# Patient Record
Sex: Female | Born: 1935 | Race: White | Hispanic: No | Smoking: Never smoker
Health system: Southern US, Community
[De-identification: ages and names within clinical notes are randomized; demographics above are authoritative.]

## PROBLEM LIST (undated history)

## (undated) DIAGNOSIS — E079 Disorder of thyroid, unspecified: Secondary | ICD-10-CM

## (undated) DIAGNOSIS — K219 Gastro-esophageal reflux disease without esophagitis: Secondary | ICD-10-CM

## (undated) DIAGNOSIS — E119 Type 2 diabetes mellitus without complications: Secondary | ICD-10-CM

## (undated) DIAGNOSIS — I10 Essential (primary) hypertension: Secondary | ICD-10-CM

## (undated) DIAGNOSIS — E876 Hypokalemia: Secondary | ICD-10-CM

---

## 2008-12-12 ENCOUNTER — Emergency Department (HOSPITAL_BASED_OUTPATIENT_CLINIC_OR_DEPARTMENT_OTHER): Admission: EM | Admit: 2008-12-12 | Discharge: 2008-12-12 | Payer: Self-pay | Admitting: Emergency Medicine

## 2008-12-12 ENCOUNTER — Ambulatory Visit: Payer: Self-pay | Admitting: Diagnostic Radiology

## 2011-02-02 ENCOUNTER — Emergency Department (HOSPITAL_BASED_OUTPATIENT_CLINIC_OR_DEPARTMENT_OTHER)
Admission: EM | Admit: 2011-02-02 | Discharge: 2011-02-02 | Disposition: A | Discharge status: Home / Self Care | Payer: No Typology Code available for payment source | Attending: Emergency Medicine | Admitting: Emergency Medicine

## 2011-02-02 ENCOUNTER — Emergency Department (INDEPENDENT_AMBULATORY_CARE_PROVIDER_SITE_OTHER): Payer: No Typology Code available for payment source

## 2011-02-02 DIAGNOSIS — E039 Hypothyroidism, unspecified: Secondary | ICD-10-CM | POA: Insufficient documentation

## 2011-02-02 DIAGNOSIS — E119 Type 2 diabetes mellitus without complications: Secondary | ICD-10-CM | POA: Insufficient documentation

## 2011-02-02 DIAGNOSIS — R4182 Altered mental status, unspecified: Secondary | ICD-10-CM

## 2011-02-02 DIAGNOSIS — Z79899 Other long term (current) drug therapy: Secondary | ICD-10-CM | POA: Insufficient documentation

## 2011-02-02 DIAGNOSIS — I10 Essential (primary) hypertension: Secondary | ICD-10-CM | POA: Insufficient documentation

## 2011-02-02 DIAGNOSIS — K219 Gastro-esophageal reflux disease without esophagitis: Secondary | ICD-10-CM | POA: Insufficient documentation

## 2011-02-02 DIAGNOSIS — R55 Syncope and collapse: Secondary | ICD-10-CM | POA: Insufficient documentation

## 2011-02-02 DIAGNOSIS — E7800 Pure hypercholesterolemia, unspecified: Secondary | ICD-10-CM | POA: Insufficient documentation

## 2011-02-02 LAB — DIFFERENTIAL
Basophils Absolute: 0 10*3/uL (ref 0.0–0.1)
Eosinophils Absolute: 0.1 10*3/uL (ref 0.0–0.7)
Eosinophils Relative: 2 % (ref 0–5)
Lymphocytes Relative: 32 % (ref 12–46)
Lymphs Abs: 2.7 10*3/uL (ref 0.7–4.0)
Neutrophils Relative %: 57 % (ref 43–77)

## 2011-02-02 LAB — COMPREHENSIVE METABOLIC PANEL
ALT: 14 U/L (ref 0–35)
AST: 23 U/L (ref 0–37)
Albumin: 4.3 g/dL (ref 3.5–5.2)
Alkaline Phosphatase: 74 U/L (ref 39–117)
BUN: 12 mg/dL (ref 6–23)
Chloride: 105 mEq/L (ref 96–112)
Potassium: 3.5 mEq/L (ref 3.5–5.1)
Sodium: 143 mEq/L (ref 135–145)
Total Bilirubin: 0.7 mg/dL (ref 0.3–1.2)
Total Protein: 7.7 g/dL (ref 6.0–8.3)

## 2011-02-02 LAB — URINALYSIS, ROUTINE W REFLEX MICROSCOPIC
Glucose, UA: NEGATIVE mg/dL
Hgb urine dipstick: NEGATIVE
Protein, ur: NEGATIVE mg/dL
Specific Gravity, Urine: 1.019 (ref 1.005–1.030)
pH: 6.5 (ref 5.0–8.0)

## 2011-02-02 LAB — URINE MICROSCOPIC-ADD ON

## 2011-02-02 LAB — CBC
MCV: 87.1 fL (ref 78.0–100.0)
Platelets: 230 10*3/uL (ref 150–400)
RBC: 4.64 MIL/uL (ref 3.87–5.11)
RDW: 13 % (ref 11.5–15.5)
WBC: 8.6 10*3/uL (ref 4.0–10.5)

## 2011-02-03 LAB — URINE CULTURE: Colony Count: NO GROWTH

## 2011-02-28 LAB — BASIC METABOLIC PANEL
CO2: 24 mEq/L (ref 19–32)
Calcium: 10.3 mg/dL (ref 8.4–10.5)
GFR calc Af Amer: >60 mL/min (ref >60)
Glucose, Bld: 144 mg/dL — ABNORMAL HIGH (ref 70–99)
Potassium: 3.8 mEq/L (ref 3.5–5.1)
Sodium: 140 mEq/L (ref 135–145)

## 2011-02-28 LAB — DIFFERENTIAL
Eosinophils Relative: 2 % (ref 0–5)
Lymphocytes Relative: 34 % (ref 12–46)
Monocytes Absolute: 0.5 10*3/uL (ref 0.1–1.0)
Monocytes Relative: 7 % (ref 3–12)
Neutro Abs: 4.5 10*3/uL (ref 1.7–7.7)

## 2011-02-28 LAB — URINALYSIS, ROUTINE W REFLEX MICROSCOPIC
Bilirubin Urine: NEGATIVE
Hgb urine dipstick: NEGATIVE
Ketones, ur: NEGATIVE mg/dL
Specific Gravity, Urine: 1.009 (ref 1.005–1.030)
pH: 8 (ref 5.0–8.0)

## 2011-02-28 LAB — CBC
HCT: 42.1 % (ref 36.0–46.0)
Hemoglobin: 14.2 g/dL (ref 12.0–15.0)
MCHC: 33.7 g/dL (ref 30.0–36.0)
RBC: 4.65 MIL/uL (ref 3.87–5.11)

## 2011-02-28 LAB — POCT CARDIAC MARKERS
CKMB, poc: 1.4 ng/mL (ref 1.0–8.0)
Myoglobin, poc: 40.8 ng/mL (ref 12–200)
Troponin i, poc: <0.05 ng/mL (ref 0.00–0.09)

## 2013-08-03 ENCOUNTER — Emergency Department (HOSPITAL_BASED_OUTPATIENT_CLINIC_OR_DEPARTMENT_OTHER)
Admission: EM | Admit: 2013-08-03 | Discharge: 2013-08-03 | Disposition: A | Discharge status: Skilled Nursing Facility | Payer: Medicare HMO | Attending: Emergency Medicine | Admitting: Emergency Medicine

## 2013-08-03 ENCOUNTER — Encounter (HOSPITAL_BASED_OUTPATIENT_CLINIC_OR_DEPARTMENT_OTHER): Payer: Self-pay

## 2013-08-03 DIAGNOSIS — Z79899 Other long term (current) drug therapy: Secondary | ICD-10-CM | POA: Insufficient documentation

## 2013-08-03 DIAGNOSIS — Z880 Allergy status to penicillin: Secondary | ICD-10-CM | POA: Insufficient documentation

## 2013-08-03 DIAGNOSIS — M545 Low back pain, unspecified: Secondary | ICD-10-CM | POA: Insufficient documentation

## 2013-08-03 DIAGNOSIS — M549 Dorsalgia, unspecified: Secondary | ICD-10-CM

## 2013-08-03 DIAGNOSIS — E119 Type 2 diabetes mellitus without complications: Secondary | ICD-10-CM | POA: Insufficient documentation

## 2013-08-03 DIAGNOSIS — I10 Essential (primary) hypertension: Secondary | ICD-10-CM | POA: Insufficient documentation

## 2013-08-03 DIAGNOSIS — K219 Gastro-esophageal reflux disease without esophagitis: Secondary | ICD-10-CM | POA: Insufficient documentation

## 2013-08-03 DIAGNOSIS — E079 Disorder of thyroid, unspecified: Secondary | ICD-10-CM | POA: Insufficient documentation

## 2013-08-03 HISTORY — DX: Gastro-esophageal reflux disease without esophagitis: K21.9

## 2013-08-03 HISTORY — DX: Hypokalemia: E87.6

## 2013-08-03 HISTORY — DX: Type 2 diabetes mellitus without complications: E11.9

## 2013-08-03 HISTORY — DX: Disorder of thyroid, unspecified: E07.9

## 2013-08-03 HISTORY — DX: Essential (primary) hypertension: I10

## 2013-08-03 LAB — URINALYSIS, ROUTINE W REFLEX MICROSCOPIC
Leukocytes, UA: NEGATIVE
Nitrite: NEGATIVE
Protein, ur: NEGATIVE mg/dL
Urobilinogen, UA: 0.2 mg/dL (ref 0.0–1.0)

## 2013-08-03 NOTE — ED Notes (Signed)
Patient arrived by EMS from [facility] with lower backpain. Patient denies fall but staff unable to verify that. On arrival patient has sling to right arm from fall and injury earlier in month. Alert and oriented and per EMS uses no assistive devices at nursing facility for ambulation

## 2013-08-03 NOTE — ED Provider Notes (Signed)
CSN: [id number]     Arrival date & time 08/03/13  1421 History   First MD Initiated Contact with Patient 08/03/13 1554     Chief Complaint  Patient presents with  . Back Pain   (Consider location/radiation/quality/duration/timing/severity/associated sxs/prior Treatment) HPI Comments: Pt denies any complaints.   Pt reports she thinks that the people at [facility] are pulling her leg.   Pt denies any complaints.   Nursing home staff reports pt complained of pain when walking.  Pt denies any current pain    Patient is a 79 y.o. female presenting with back pain. The history is provided by the patient and the nursing home. No language interpreter was used.  Back Pain Associated symptoms: no dysuria     Past Medical History  Diagnosis Date  . Hypokalemia   . Hypertension   . Diabetes mellitus without complication   . GERD (gastroesophageal reflux disease)   . Thyroid disease    History reviewed. No pertinent past surgical history. No family history on file. History  Substance Use Topics  . Smoking status: Never Smoker   . Smokeless tobacco: Not on file  . Alcohol Use: No   OB History   Grav Para Term Preterm Abortions TAB SAB Ect Mult Living                 Review of Systems  Genitourinary: Negative for dysuria, frequency and hematuria.  Musculoskeletal: Negative for back pain and gait problem.  All other systems reviewed and are negative.    Allergies  Penicillins  Home Medications   Current Outpatient Rx  Name  Route  Sig  Dispense  Refill  . alendronate (FOSAMAX) 70 MG tablet   Oral   Take 70 mg by mouth every 7 (seven) days. Take with a full glass of water on an empty stomach.         Marland Kitchen amLODipine (NORVASC) 10 MG tablet   Oral   Take 10 mg by mouth daily.         Marland Kitchen atorvastatin (LIPITOR) 10 MG tablet   Oral   Take 10 mg by mouth daily.         Marland Kitchen docusate sodium (COLACE) 100 MG capsule   Oral   Take 100 mg by mouth 2 (two) times daily.          Marland Kitchen docusate sodium (COLACE) 100 MG capsule   Oral   Take 100 mg by mouth 2 (two) times daily.         Marland Kitchen donepezil (ARICEPT) 10 MG tablet   Oral   Take 10 mg by mouth at bedtime as needed.         . fluticasone (FLONASE) 50 MCG/ACT nasal spray   Nasal   Place 2 sprays into the nose daily.         Marland Kitchen HYDROcodone-acetaminophen (NORCO/VICODIN) 5-325 MG per tablet   Oral   Take 1 tablet by mouth every 6 (six) hours as needed for pain.         Marland Kitchen levothyroxine (SYNTHROID, LEVOTHROID) 50 MCG tablet   Oral   Take 50 mcg by mouth daily before breakfast.         . memantine (NAMENDA) 10 MG tablet   Oral   Take 10 mg by mouth 2 (two) times daily.         Marland Kitchen omeprazole (PRILOSEC) 20 MG capsule   Oral   Take 20 mg by mouth daily.         Marland Kitchen  polyethylene glycol (MIRALAX / GLYCOLAX) packet   Oral   Take 17 g by mouth daily.          BP 113/59  Pulse 56  Temp(Src) 97.7 F (36.5 C) (Oral)  Resp 16  Wt 130 lb (58.968 kg)  SpO2 99% Physical Exam  Nursing note and vitals reviewed. Constitutional: She appears well-developed and well-nourished.  HENT:  Head: Normocephalic.  Eyes: EOM are normal. Pupils are equal, round, and reactive to light.  Neck: Normal range of motion.  Cardiovascular: Normal rate and regular rhythm.   Pulmonary/Chest: Effort normal and breath sounds normal.  Abdominal: Soft. She exhibits no distension.  Musculoskeletal: Normal range of motion.  Neurological: She is alert.  Skin: Skin is warm.  Psychiatric: She has a normal mood and affect.    ED Course  Procedures (including critical care time) Labs Review Labs Reviewed  URINALYSIS, ROUTINE W REFLEX MICROSCOPIC   Imaging Review No results found.  MDM   1. Back pain    Urine is negative   Pt continues to deny complaints.   Pt ambulates without difficulty    Elson Areas, [place] 08/03/13 [id number]

## 2013-08-03 NOTE — ED Provider Notes (Signed)
Medical screening examination/treatment/procedure(s) were conducted as a shared visit with non-physician practitioner(s) and myself.  I personally evaluated the patient during the encounter.  Pt denies back pain and back NT.  Hurman Horn, MD 08/04/13 [phone]

## 2013-08-03 NOTE — ED Notes (Signed)
[facility] called to transport back to [facility]

## 2014-11-05 ENCOUNTER — Encounter (HOSPITAL_COMMUNITY): Payer: Self-pay | Admitting: Emergency Medicine

## 2014-11-05 ENCOUNTER — Emergency Department (HOSPITAL_COMMUNITY)
Admission: EM | Admit: 2014-11-05 | Discharge: 2014-11-05 | Disposition: A | Discharge status: Home / Self Care | Payer: Medicare HMO | Attending: Emergency Medicine | Admitting: Emergency Medicine

## 2014-11-05 ENCOUNTER — Emergency Department (HOSPITAL_COMMUNITY): Payer: Medicare HMO

## 2014-11-05 DIAGNOSIS — Y998 Other external cause status: Secondary | ICD-10-CM | POA: Diagnosis not present

## 2014-11-05 DIAGNOSIS — Y9289 Other specified places as the place of occurrence of the external cause: Secondary | ICD-10-CM | POA: Diagnosis not present

## 2014-11-05 DIAGNOSIS — Y9301 Activity, walking, marching and hiking: Secondary | ICD-10-CM | POA: Diagnosis not present

## 2014-11-05 DIAGNOSIS — Z880 Allergy status to penicillin: Secondary | ICD-10-CM | POA: Diagnosis not present

## 2014-11-05 DIAGNOSIS — E119 Type 2 diabetes mellitus without complications: Secondary | ICD-10-CM | POA: Diagnosis not present

## 2014-11-05 DIAGNOSIS — Z7951 Long term (current) use of inhaled steroids: Secondary | ICD-10-CM | POA: Diagnosis not present

## 2014-11-05 DIAGNOSIS — K219 Gastro-esophageal reflux disease without esophagitis: Secondary | ICD-10-CM | POA: Diagnosis not present

## 2014-11-05 DIAGNOSIS — S50312A Abrasion of left elbow, initial encounter: Secondary | ICD-10-CM

## 2014-11-05 DIAGNOSIS — S59912A Unspecified injury of left forearm, initial encounter: Secondary | ICD-10-CM | POA: Diagnosis not present

## 2014-11-05 DIAGNOSIS — S199XXA Unspecified injury of neck, initial encounter: Secondary | ICD-10-CM | POA: Insufficient documentation

## 2014-11-05 DIAGNOSIS — E079 Disorder of thyroid, unspecified: Secondary | ICD-10-CM | POA: Diagnosis not present

## 2014-11-05 DIAGNOSIS — Z79899 Other long term (current) drug therapy: Secondary | ICD-10-CM | POA: Diagnosis not present

## 2014-11-05 DIAGNOSIS — Y9389 Activity, other specified: Secondary | ICD-10-CM | POA: Insufficient documentation

## 2014-11-05 DIAGNOSIS — W01198A Fall on same level from slipping, tripping and stumbling with subsequent striking against other object, initial encounter: Secondary | ICD-10-CM | POA: Diagnosis not present

## 2014-11-05 DIAGNOSIS — S0093XA Contusion of unspecified part of head, initial encounter: Secondary | ICD-10-CM | POA: Diagnosis not present

## 2014-11-05 DIAGNOSIS — I10 Essential (primary) hypertension: Secondary | ICD-10-CM | POA: Diagnosis not present

## 2014-11-05 DIAGNOSIS — S0990XA Unspecified injury of head, initial encounter: Secondary | ICD-10-CM | POA: Diagnosis present

## 2014-11-05 DIAGNOSIS — W19XXXA Unspecified fall, initial encounter: Secondary | ICD-10-CM

## 2014-11-05 LAB — URINE MICROSCOPIC-ADD ON

## 2014-11-05 LAB — URINALYSIS, ROUTINE W REFLEX MICROSCOPIC
BILIRUBIN URINE: NEGATIVE
Glucose, UA: NEGATIVE mg/dL
HGB URINE DIPSTICK: NEGATIVE
Ketones, ur: NEGATIVE mg/dL
Nitrite: NEGATIVE
PH: 6 (ref 5.0–8.0)
Protein, ur: NEGATIVE mg/dL
SPECIFIC GRAVITY, URINE: 1.01 (ref 1.005–1.030)
Urobilinogen, UA: 0.2 mg/dL (ref 0.0–1.0)

## 2014-11-05 NOTE — ED Notes (Signed)
Pt to CT at this time.

## 2014-11-05 NOTE — Progress Notes (Signed)
CSW met with patient at bedside. There was no family present. Patient informed CSW that she lives at [facility]. Patient states she fell today. Patient states she knows she was not in her room during the fall, but is not sure of exactly where at inside the facility.   Patient says that her neck hurts. Also, patient stated that her leg hurts.  Patient states that she can not hear very well.  Willette Brace [phone] ED CSW 11/05/2014 9:31 PM

## 2014-11-05 NOTE — ED Notes (Signed)
Pt BIB EMS, from [facility], stated that she was ambulating, without her walker, and tripped, over unknown object, and fell to the floor. Pt c/o R sided neck pain, placed in c-collar, L elbow pain, skin tear noted with bleeding controlled with bandage placed by EMS, and L hand pain with bruising noted to palm. Pt noted to have hematoma over L eyebrow, denies HA but reports pain upon palpation. Pt alert, oriented to baseline. VSS.

## 2014-11-05 NOTE — ED Provider Notes (Signed)
CSN: [id number]     Arrival date & time 11/05/14  2055 History   First MD Initiated Contact with Patient 11/05/14 2057     Chief Complaint  Patient presents with  . Fall     Patient is a 80 y.o. female presenting with fall. The history is provided by the patient. No language interpreter was used.  Fall    [name] is here for evaluation a fall. She does not recall what made her fall, but per her report she tripped while she was walking with her walker and hit her head. She complained of head and neck pain and left arm pain. She currently denies any head or neck pain but states that her left forearm is sore. She denies any recent illnesses. She denies chest pain, shortness of breath, abdominal pain, nausea, vomiting, fevers. Symptoms are mild and improving.  Past Medical History  Diagnosis Date  . Hypokalemia   . Hypertension   . Diabetes mellitus without complication   . GERD (gastroesophageal reflux disease)   . Thyroid disease    History reviewed. No pertinent past surgical history. History reviewed. No pertinent family history. History  Substance Use Topics  . Smoking status: Never Smoker   . Smokeless tobacco: Never Used  . Alcohol Use: No   OB History    No data available     Review of Systems  All other systems reviewed and are negative.     Allergies  Penicillins  Home Medications   Prior to Admission medications   Medication Sig Start Date End Date Taking? Authorizing Provider  alendronate (FOSAMAX) 70 MG tablet Take 70 mg by mouth every 7 (seven) days. Take with a full glass of water on an empty stomach.    Historical Provider, MD  amLODipine (NORVASC) 10 MG tablet Take 10 mg by mouth daily.    Historical Provider, MD  atorvastatin (LIPITOR) 10 MG tablet Take 10 mg by mouth daily.    Historical Provider, MD  docusate sodium (COLACE) 100 MG capsule Take 100 mg by mouth 2 (two) times daily.    Historical Provider, MD  docusate sodium (COLACE) 100 MG  capsule Take 100 mg by mouth 2 (two) times daily.    Historical Provider, MD  donepezil (ARICEPT) 10 MG tablet Take 10 mg by mouth at bedtime as needed.    Historical Provider, MD  fluticasone (FLONASE) 50 MCG/ACT nasal spray Place 2 sprays into the nose daily.    Historical Provider, MD  HYDROcodone-acetaminophen (NORCO/VICODIN) 5-325 MG per tablet Take 1 tablet by mouth every 6 (six) hours as needed for pain.    Historical Provider, MD  levothyroxine (SYNTHROID, LEVOTHROID) 50 MCG tablet Take 50 mcg by mouth daily before breakfast.    Historical Provider, MD  memantine (NAMENDA) 10 MG tablet Take 10 mg by mouth 2 (two) times daily.    Historical Provider, MD  omeprazole (PRILOSEC) 20 MG capsule Take 20 mg by mouth daily.    Historical Provider, MD  polyethylene glycol (MIRALAX / GLYCOLAX) packet Take 17 g by mouth daily.    Historical Provider, MD   BP 145/61 mmHg  Pulse 68  Temp(Src) 97.9 F (36.6 C) (Oral)  Resp 16  Wt 130 lb (58.968 kg) Physical Exam  Constitutional: She appears well-developed and well-nourished.  HENT:  Ecchymosis and swelling to left forehead  Eyes: Pupils are equal, round, and reactive to light.  Neck:  C collar in place. There is no C-spine tenderness to palpation.  Cardiovascular:  Normal rate and regular rhythm.   No murmur heard. Pulmonary/Chest: Effort normal and breath sounds normal. No respiratory distress. She exhibits no tenderness.  Abdominal: Soft. There is no tenderness. There is no rebound and no guarding.  Musculoskeletal:  No bony tenderness over the hips or bilateral upper extremities. There is a skin tear and abrasion over the left elbow. Patient able to fully range elbow wrist and hand.  Neurological: She is alert.  Disoriented to time. 5 out of 5 strength in all 4 extremities.  Skin: Skin is warm and dry.  Psychiatric: She has a normal mood and affect. Her behavior is normal.  Nursing note and vitals reviewed.   ED Course  Procedures  (including critical care time) Labs Review Labs Reviewed  URINALYSIS, ROUTINE W REFLEX MICROSCOPIC    Imaging Review Ct Head Wo Contrast  11/05/2014   CLINICAL DATA:  Tripped and fell, right-sided neck pain, left supraorbital hematoma  EXAM: CT HEAD WITHOUT CONTRAST  CT CERVICAL SPINE WITHOUT CONTRAST  TECHNIQUE: Multidetector CT imaging of the head and cervical spine was performed following the standard protocol without intravenous contrast. Multiplanar CT image reconstructions of the cervical spine were also generated.  COMPARISON:  07/31/2013 head CT, [facility]  FINDINGS: CT HEAD FINDINGS  Left supraorbital soft tissue swelling is evident. The intraconal fat is clean. No skull fracture identified. Paranasal sinuses are clear. Mild cortical volume loss noted with proportional ventricular prominence. Areas of periventricular white matter hypodensity are most compatible with small vessel ischemic change. No acute hemorrhage, infarct, or mass lesion is identified. Right lens density may indicate cataract formation.  CT CERVICAL SPINE FINDINGS  C1 through the cervicothoracic junction is visualized in its entirety. No precervical soft tissue widening. Alignment is normal. Soft tissue density material within the left external auditory canal is most likely cerumen. Multilevel mild facet osteoarthritic change is noted. Vertebral body heights are maintained. Mild mid cervical disc degenerative changes noted spanning C4 through C6. No fracture or dislocation. Carotid bulb atheromatous calcification noted.  IMPRESSION: No acute intracranial finding.  Left supraorbital swelling/hematoma.  No acute cervical spine fracture.   Electronically Signed   By: Christiana Pellant M.D.   On: 11/05/2014 22:24   Ct Cervical Spine Wo Contrast  11/05/2014   CLINICAL DATA:  Tripped and fell, right-sided neck pain, left supraorbital hematoma  EXAM: CT HEAD WITHOUT CONTRAST  CT CERVICAL SPINE WITHOUT CONTRAST   TECHNIQUE: Multidetector CT imaging of the head and cervical spine was performed following the standard protocol without intravenous contrast. Multiplanar CT image reconstructions of the cervical spine were also generated.  COMPARISON:  07/31/2013 head CT, [facility]  FINDINGS: CT HEAD FINDINGS  Left supraorbital soft tissue swelling is evident. The intraconal fat is clean. No skull fracture identified. Paranasal sinuses are clear. Mild cortical volume loss noted with proportional ventricular prominence. Areas of periventricular white matter hypodensity are most compatible with small vessel ischemic change. No acute hemorrhage, infarct, or mass lesion is identified. Right lens density may indicate cataract formation.  CT CERVICAL SPINE FINDINGS  C1 through the cervicothoracic junction is visualized in its entirety. No precervical soft tissue widening. Alignment is normal. Soft tissue density material within the left external auditory canal is most likely cerumen. Multilevel mild facet osteoarthritic change is noted. Vertebral body heights are maintained. Mild mid cervical disc degenerative changes noted spanning C4 through C6. No fracture or dislocation. Carotid bulb atheromatous calcification noted.  IMPRESSION: No acute intracranial finding.  Left supraorbital swelling/hematoma.  No acute cervical spine fracture.   Electronically Signed   By: Christiana PellantGretchen  Green M.D.   On: 11/05/2014 22:24     EKG Interpretation None      MDM   Final diagnoses:  None    Patient here for evaluation of injuries following fall. Her history sounds like a mechanical fall. Patient does have ecchymosis and swelling over the left forehead, CT scan without evidence of acute intracranial abnormality. C-spine is negative for fractures patient has no pain in her neck c-collar was DC'd after imaging. Patient does have an abrasion over her left elbow. There is no bony tenderness over the left upper extremity and she  is able to fully range the hand and wrist although and shoulder. Do not feel that x-rays are warranted at this time. Patient is able to ambulate in the department at her baseline. Plan for DC'd back home with Tylenol when necessary pain and fall precautions.  UA equivocal for UTI. Patient is without urinary symptoms. Will send culture and only treat if positive.  Tilden FossaElizabeth Pebbles Zeiders, MD 11/06/14 [phone]

## 2014-11-05 NOTE — Discharge Instructions (Signed)
You had a CT of your head and neck in the Emergency Department.  These scans showed chronic changes, but no injuries from your fall.  Please be careful when walking to avoid future falls.  Clean the abrasion on your left elbow regularly and keep bandaged.  Contact your family doctor to see if your tetanus shot needs updating.     Abrasion An abrasion is a cut or scrape of the skin. Abrasions do not extend through all layers of the skin and most heal within 10 days. It is important to care for your abrasion properly to prevent infection. CAUSES  Most abrasions are caused by falling on, or gliding across, the ground or other surface. When your skin rubs on something, the outer and inner layer of skin rubs off, causing an abrasion. DIAGNOSIS  Your caregiver will be able to diagnose an abrasion during a physical exam.  TREATMENT  Your treatment depends on how large and deep the abrasion is. Generally, your abrasion will be cleaned with water and a mild soap to remove any dirt or debris. An antibiotic ointment may be put over the abrasion to prevent an infection. A bandage (dressing) may be wrapped around the abrasion to keep it from getting dirty.  You may need a tetanus shot if:  You cannot remember when you had your last tetanus shot.  You have never had a tetanus shot.  The injury broke your skin. If you get a tetanus shot, your arm may swell, get red, and feel warm to the touch. This is common and not a problem. If you need a tetanus shot and you choose not to have one, there is a rare chance of getting tetanus. Sickness from tetanus can be serious.  HOME CARE INSTRUCTIONS   If a dressing was applied, change it at least once a day or as directed by your caregiver. If the bandage sticks, soak it off with warm water.   Wash the area with water and a mild soap to remove all the ointment 2 times a day. Rinse off the soap and pat the area dry with a clean towel.   Reapply any ointment as  directed by your caregiver. This will help prevent infection and keep the bandage from sticking. Use gauze over the wound and under the dressing to help keep the bandage from sticking.   Change your dressing right away if it becomes wet or dirty.   Only take over-the-counter or prescription medicines for pain, discomfort, or fever as directed by your caregiver.   Follow up with your caregiver within 24-48 hours for a wound check, or as directed. If you were not given a wound-check appointment, look closely at your abrasion for redness, swelling, or pus. These are signs of infection. SEEK IMMEDIATE MEDICAL CARE IF:   You have increasing pain in the wound.   You have redness, swelling, or tenderness around the wound.   You have pus coming from the wound.   You have a fever or persistent symptoms for more than 2-3 days.  You have a fever and your symptoms suddenly get worse.  You have a bad smell coming from the wound or dressing.  MAKE SURE YOU:   Understand these instructions.  Will watch your condition.  Will get help right away if you are not doing well or get worse. Document Released: 08/10/2005 Document Revised: 10/17/2012 Document Reviewed: 10/04/2011 [facility] Patient Information 2015 [place], [place]. This information is not intended to replace advice given to you  by your health care provider. Make sure you discuss any questions you have with your health care provider.  Facial or Scalp Contusion A facial or scalp contusion is a deep bruise on the face or head. Injuries to the face and head generally cause a lot of swelling, especially around the eyes. Contusions are the result of an injury that caused bleeding under the skin. The contusion may turn blue, purple, or yellow. Minor injuries will give you a painless contusion, but more severe contusions may stay painful and swollen for a few weeks.  CAUSES  A facial or scalp contusion is caused by a blunt injury or trauma to  the face or head area.  SIGNS AND SYMPTOMS   Swelling of the injured area.   Discoloration of the injured area.   Tenderness, soreness, or pain in the injured area.  DIAGNOSIS  The diagnosis can be made by taking a medical history and doing a physical exam. An X-ray exam, CT scan, or MRI may be needed to determine if there are any associated injuries, such as broken bones (fractures). TREATMENT  Often, the best treatment for a facial or scalp contusion is applying cold compresses to the injured area. Over-the-counter medicines may also be recommended for pain control.  HOME CARE INSTRUCTIONS   Only take over-the-counter or prescription medicines as directed by your health care provider.   Apply ice to the injured area.   Put ice in a plastic bag.   Place a towel between your skin and the bag.   Leave the ice on for 20 minutes, 2-3 times a day.  SEEK MEDICAL CARE IF:  You have bite problems.   You have pain with chewing.   You are concerned about facial defects. SEEK IMMEDIATE MEDICAL CARE IF:  You have severe pain or a headache that is not relieved by medicine.   You have unusual sleepiness, confusion, or personality changes.   You throw up (vomit).   You have a persistent nosebleed.   You have double vision or blurred vision.   You have fluid drainage from your nose or ear.   You have difficulty walking or using your arms or legs.  MAKE SURE YOU:   Understand these instructions.  Will watch your condition.  Will get help right away if you are not doing well or get worse. Document Released: 12/08/2004 Document Revised: 08/21/2013 Document Reviewed: 06/13/2013 [facility] Patient Information 2015 [place], [place]. This information is not intended to replace advice given to you by your health care provider. Make sure you discuss any questions you have with your health care provider.  Fall Prevention and Home Safety Falls cause injuries and can affect  all age groups. It is possible to use preventive measures to significantly decrease the likelihood of falls. There are many simple measures which can make your home safer and prevent falls. OUTDOORS  Repair cracks and edges of walkways and driveways.  Remove high doorway thresholds.  Trim shrubbery on the main path into your home.  Have good outside lighting.  Clear walkways of tools, rocks, debris, and clutter.  Check that handrails are not broken and are securely fastened. Both sides of steps should have handrails.  Have leaves, snow, and ice cleared regularly.  Use sand or salt on walkways during winter months.  In the garage, clean up grease or oil spills. BATHROOM  Install night lights.  Install grab bars by the toilet and in the tub and shower.  Use non-skid mats or decals  in the tub or shower.  Place a plastic non-slip stool in the shower to sit on, if needed.  Keep floors dry and clean up all water on the floor immediately.  Remove soap buildup in the tub or shower on a regular basis.  Secure bath mats with non-slip, double-sided rug tape.  Remove throw rugs and tripping hazards from the floors. BEDROOMS  Install night lights.  Make sure a bedside light is easy to reach.  Do not use oversized bedding.  Keep a telephone by your bedside.  Have a firm chair with side arms to use for getting dressed.  Remove throw rugs and tripping hazards from the floor. KITCHEN  Keep handles on pots and pans turned toward the center of the stove. Use back burners when possible.  Clean up spills quickly and allow time for drying.  Avoid walking on wet floors.  Avoid hot utensils and knives.  Position shelves so they are not too high or low.  Place commonly used objects within easy reach.  If necessary, use a sturdy step stool with a grab bar when reaching.  Keep electrical cables out of the way.  Do not use floor polish or wax that makes floors slippery. If you  must use wax, use non-skid floor wax.  Remove throw rugs and tripping hazards from the floor. STAIRWAYS  Never leave objects on stairs.  Place handrails on both sides of stairways and use them. Fix any loose handrails. Make sure handrails on both sides of the stairways are as long as the stairs.  Check carpeting to make sure it is firmly attached along stairs. Make repairs to worn or loose carpet promptly.  Avoid placing throw rugs at the top or bottom of stairways, or properly secure the rug with carpet tape to prevent slippage. Get rid of throw rugs, if possible.  Have an electrician put in a light switch at the top and bottom of the stairs. OTHER FALL PREVENTION TIPS  Wear low-heel or rubber-soled shoes that are supportive and fit well. Wear closed toe shoes.  When using a stepladder, make sure it is fully opened and both spreaders are firmly locked. Do not climb a closed stepladder.  Add color or contrast paint or tape to grab bars and handrails in your home. Place contrasting color strips on first and last steps.  Learn and use mobility aids as needed. Install an electrical emergency response system.  Turn on lights to avoid dark areas. Replace light bulbs that burn out immediately. Get light switches that glow.  Arrange furniture to create clear pathways. Keep furniture in the same place.  Firmly attach carpet with non-skid or double-sided tape.  Eliminate uneven floor surfaces.  Select a carpet pattern that does not visually hide the edge of steps.  Be aware of all pets. OTHER HOME SAFETY TIPS  Set the water temperature for 120 F (48.8 C).  Keep emergency numbers on or near the telephone.  Keep smoke detectors on every level of the home and near sleeping areas. Document Released: 10/21/2002 Document Revised: 05/01/2012 Document Reviewed: 01/20/2012 [facility] Patient Information 2015 [place], [place]. This information is not intended to replace advice given to you by  your health care provider. Make sure you discuss any questions you have with your health care provider.

## 2014-11-05 NOTE — ED Notes (Signed)
Bed: [id number] Expected date: 11/05/14 Expected time: 8:38 PM Means of arrival: Ambulance Comments: 80 yo F  Fall

## 2014-11-07 LAB — URINE CULTURE: Colony Count: 50000

## 2016-04-04 IMAGING — CT CT CERVICAL SPINE W/O CM
2 of 4 series · 6 of 14 positions shown, 7 images · non-contrast
Comparison: 07/31/2013 head CT, [REDACTED]

CLINICAL DATA: Tripped and fell, right-sided neck pain, left
supraorbital hematoma

EXAM:
CT HEAD WITHOUT CONTRAST
CT CERVICAL SPINE WITHOUT CONTRAST
TECHNIQUE: Multidetector CT imaging of the head and cervical spine was
performed following the standard protocol without intravenous
contrast. Multiplanar CT image reconstructions of the cervical spine
were also generated.

[Series 5: c-spine st · axial · 0.23mm/px · z∈[-178,-86]mm · 3 of 92 slices shown, 4 images]
[im 23/92  soft-tissue]
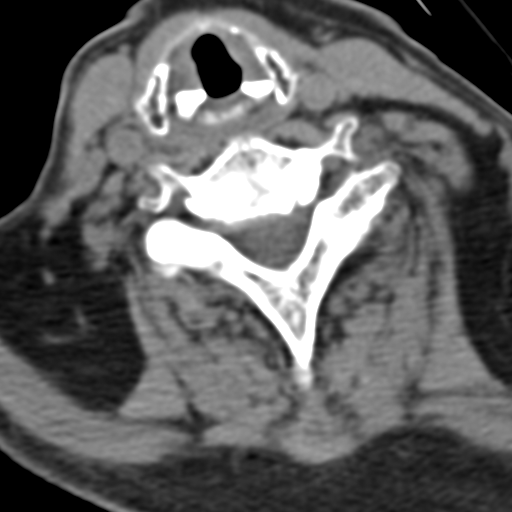
[im 23/92  bone]
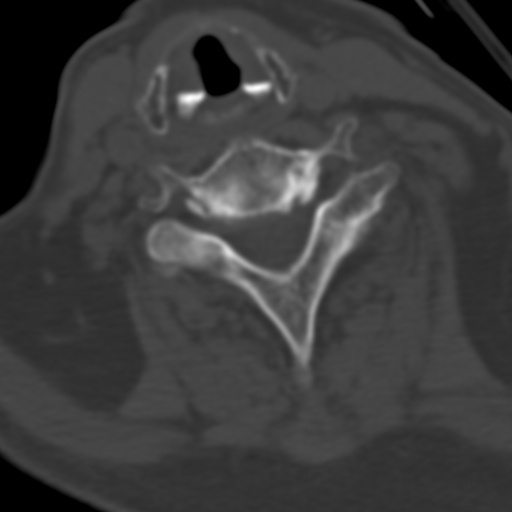
[im 46/92  bone]
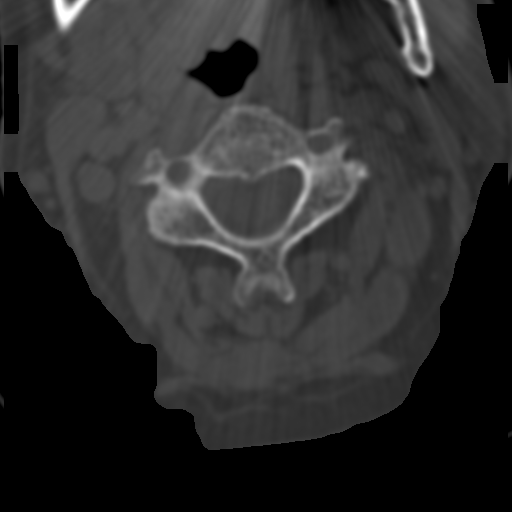
[im 69/92  bone]
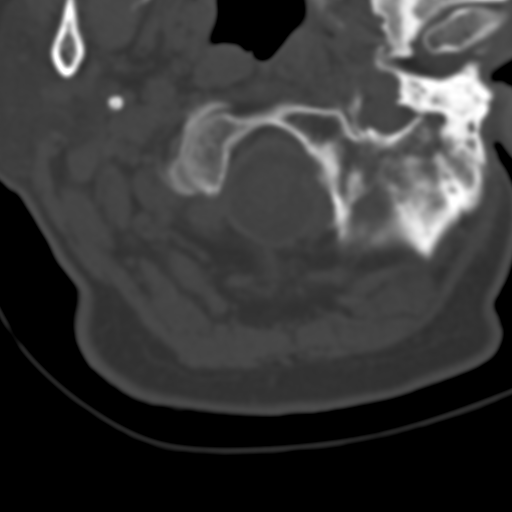

[Series 8: axial reformats · axial · 0.18mm/px · z∈[-189,-100]mm · 3 of 91 slices shown]
[im 23/91  bone]
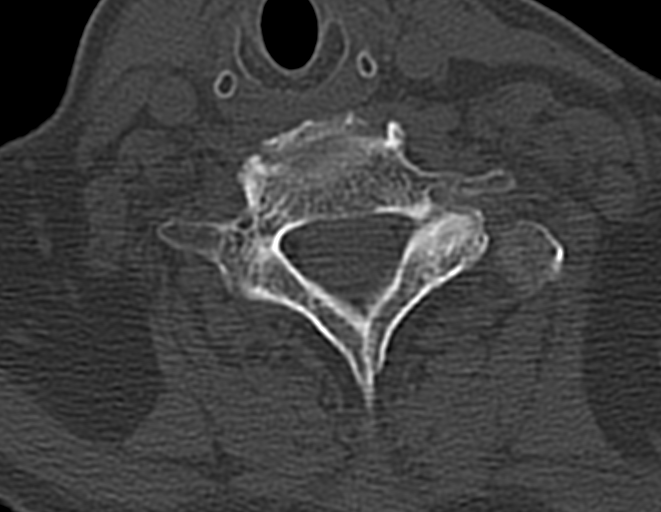
[im 46/91  bone]
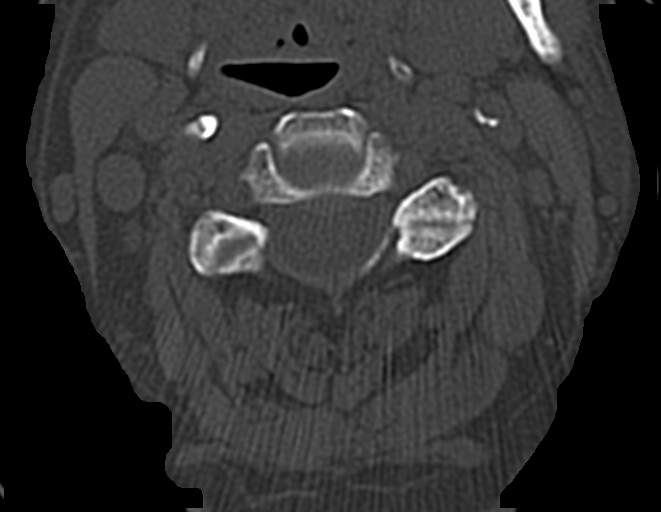
[im 68/91  bone]
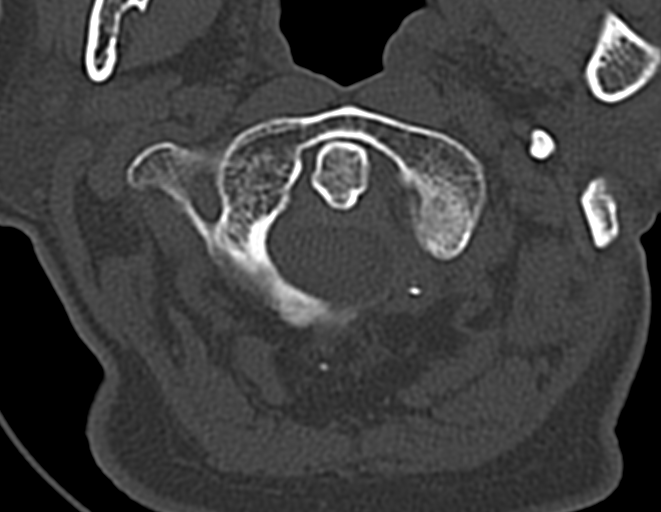

[6 of 14 positions shown; findings below may reference images not displayed]

FINDINGS: CT HEAD FINDINGS

Left supraorbital soft tissue swelling is evident. The intraconal
fat is clean. No skull fracture identified. Paranasal sinuses are
clear. Mild cortical volume loss noted with proportional ventricular
prominence. Areas of periventricular white matter hypodensity are
most compatible with small vessel ischemic change. No acute
hemorrhage, infarct, or mass lesion is identified. Right lens
density may indicate cataract formation.

CT CERVICAL SPINE FINDINGS

C1 through the cervicothoracic junction is visualized in its
entirety. No precervical soft tissue widening. Alignment is normal.
Soft tissue density material within the left external auditory canal
is most likely cerumen. Multilevel mild facet osteoarthritic change
is noted. Vertebral body heights are maintained. Mild mid cervical
disc degenerative changes noted spanning C4 through C6. No fracture
or dislocation. Carotid bulb atheromatous calcification noted.
IMPRESSION: No acute intracranial finding.  Left supraorbital swelling/hematoma.

No acute cervical spine fracture.

## 2017-09-14 DEATH — deceased
